# Patient Record
Sex: Female | Born: 1975 | Hispanic: No | Marital: Married | Smoking: Never smoker
Health system: Southern US, Community
[De-identification: ages and names within clinical notes are randomized; demographics above are authoritative.]

## PROBLEM LIST (undated history)

## (undated) DIAGNOSIS — Z789 Other specified health status: Secondary | ICD-10-CM

## (undated) HISTORY — PX: ECTOPIC PREGNANCY SURGERY: SHX613

---

## 1998-04-25 ENCOUNTER — Ambulatory Visit (HOSPITAL_COMMUNITY): Admission: RE | Admit: 1998-04-25 | Discharge: 1998-04-25 | Payer: Self-pay | Admitting: *Deleted

## 1998-06-09 ENCOUNTER — Ambulatory Visit (HOSPITAL_COMMUNITY): Admission: RE | Admit: 1998-06-09 | Discharge: 1998-06-09 | Payer: Self-pay | Admitting: Obstetrics

## 1998-11-04 ENCOUNTER — Inpatient Hospital Stay (HOSPITAL_COMMUNITY): Admission: AD | Admit: 1998-11-04 | Discharge: 1998-11-06 | Payer: Self-pay | Admitting: *Deleted

## 2012-05-15 ENCOUNTER — Other Ambulatory Visit (HOSPITAL_COMMUNITY)
Admission: RE | Admit: 2012-05-15 | Discharge: 2012-05-15 | Disposition: A | Discharge status: Home / Self Care | Payer: BC Managed Care – PPO | Source: Ambulatory Visit | Attending: Family Medicine | Admitting: Family Medicine

## 2012-05-15 DIAGNOSIS — Z124 Encounter for screening for malignant neoplasm of cervix: Secondary | ICD-10-CM | POA: Insufficient documentation

## 2012-05-15 DIAGNOSIS — Z1151 Encounter for screening for human papillomavirus (HPV): Secondary | ICD-10-CM | POA: Insufficient documentation

## 2012-10-24 ENCOUNTER — Ambulatory Visit (INDEPENDENT_AMBULATORY_CARE_PROVIDER_SITE_OTHER): Payer: BC Managed Care – PPO | Admitting: Physician Assistant

## 2012-10-24 VITALS — BP 100/63 | HR 80 | Temp 102.6°F | Resp 16 | Ht 64.5 in | Wt 111.6 lb

## 2012-10-24 DIAGNOSIS — R05 Cough: Secondary | ICD-10-CM

## 2012-10-24 DIAGNOSIS — R509 Fever, unspecified: Secondary | ICD-10-CM

## 2012-10-24 DIAGNOSIS — J101 Influenza due to other identified influenza virus with other respiratory manifestations: Secondary | ICD-10-CM

## 2012-10-24 DIAGNOSIS — J029 Acute pharyngitis, unspecified: Secondary | ICD-10-CM

## 2012-10-24 DIAGNOSIS — J111 Influenza due to unidentified influenza virus with other respiratory manifestations: Secondary | ICD-10-CM

## 2012-10-24 DIAGNOSIS — R059 Cough, unspecified: Secondary | ICD-10-CM

## 2012-10-24 LAB — POCT INFLUENZA A/B
Influenza A, POC: POSITIVE
Influenza B, POC: NEGATIVE

## 2012-10-24 MED ORDER — HYDROCOD POLST-CHLORPHEN POLST 10-8 MG/5ML PO LQCR: 5.0000 mL | Freq: Two times a day (BID) | ORAL | Status: DC | PRN

## 2012-10-24 MED ORDER — OSELTAMIVIR PHOSPHATE 75 MG PO CAPS: 75.0000 mg | ORAL_CAPSULE | Freq: Two times a day (BID) | ORAL | Status: DC

## 2012-10-24 MED ORDER — IBUPROFEN 200 MG PO TABS
600.0000 mg | ORAL_TABLET | Freq: Once | ORAL | Status: AC
  Administered 2012-10-24: 600 mg via ORAL

## 2012-10-24 NOTE — Progress Notes (Signed)
Patient ID: Brianna Estrada MRN: [id number], DOB: [birth year], 36 y.o. Date of Encounter: 10/24/2012, 2:04 PM  Primary Physician: No primary provider on file.  Chief Complaint: fever, chills, nasal congestion  HPI: 36 y.o. year old female with presents with 1 day history of nasal congestion, fever, chills, and slight dry cough. Complains of chest pain while coughing. No SOB, nausea, vomiting, or dizziness. Has not taken any medications yet for this. Does admit that her 52 year old son was diagnosed with the flu 3 days ago.   Patient is otherwise doing well without issues or complaints.  History reviewed. No pertinent past medical history.   Home Meds: Prior to Admission medications   Medication Sig Start Date End Date Taking? Authorizing Provider  chlorpheniramine-HYDROcodone (TUSSIONEX PENNKINETIC ER) 10-8 MG/5ML LQCR Take 5 mLs by mouth every 12 (twelve) hours as needed (cough). 10/24/12   Cheyanne Lamison Jaquita Rector, PA-C  oseltamivir (TAMIFLU) 75 MG capsule Take 1 capsule (75 mg total) by mouth 2 (two) times daily. 10/24/12   Nelva Nay, PA-C    Allergies: Not on File  History   Social History  . Marital Status: Married    Spouse Name: N/A    Number of Children: N/A  . Years of Education: N/A   Occupational History  . Not on file.   Social History Main Topics  . Smoking status: Not on file  . Smokeless tobacco: Not on file  . Alcohol Use: Not on file  . Drug Use: Not on file  . Sexually Active: Not on file   Other Topics Concern  . Not on file   Social History Narrative  . No narrative on file     Review of Systems: Constitutional: positive for chills and fever. Negative for night sweats, weight changes, or fatigue  HEENT: negative for vision changes, hearing loss. Positive for congestion, rhinorrhea, ST. No epistaxis, or sinus pressure Cardiovascular: negative for chest pain or palpitations Respiratory: positive for cough. negative for hemoptysis, wheezing, shortness of  breath. Abdominal: negative for abdominal pain, nausea, vomiting, diarrhea, or constipation Dermatological: negative for rashes. Neurologic: negative for headache, dizziness, or syncope   Physical Exam: Blood pressure 100/63, pulse 80, temperature 102.6 F (39.2 C), temperature source Oral, resp. rate 16, height 5' 4.5" (1.638 m), weight 111 lb 9.6 oz (50.621 kg)., Body mass index is 18.86 kg/(m^2). General: Well developed, well nourished, in no acute distress. Head: Normocephalic, atraumatic, eyes without discharge, sclera non-icteric, nares are without discharge. Bilateral auditory canals clear, TM's are without perforation, pearly grey and translucent with reflective cone of light bilaterally. Oral cavity moist, posterior pharynx without exudate, erythema, peritonsillar abscess, or post nasal drip.  Neck: Supple. No thyromegaly. Full ROM. No lymphadenopathy. Lungs: Clear bilaterally to auscultation without wheezes, rales, or rhonchi. Breathing is unlabored. Heart: RRR with S1 S2. No murmurs, rubs, or gallops appreciated. Msk:  Strength and tone normal for age. Extremities/Skin: Warm and dry. No clubbing or cyanosis. No edema.  Neuro: Alert and oriented X 3. Moves all extremities spontaneously. Gait is normal. CNII-XII grossly in tact. Psych:  Responds to questions appropriately with a normal affect.   Labs: Results for orders placed in visit on 10/24/12  POCT INFLUENZA A/B      Component Value Range   Influenza A, POC Positive     Influenza B, POC Negative       ASSESSMENT AND PLAN:  36 y.o. year old female with influenza Increase fluids and rest Ibuprofen and tylenol in alternating  doses q3hours Start Tamiflu 75 mg bid x 5 days Tussionex q12hours prn cough Follow up if symptoms worsen or fail to improve.  Grier Mitts, PA-C 10/24/2012 2:04 PM

## 2012-10-24 NOTE — Patient Instructions (Addendum)
Influenza Facts  Flu (influenza) is a contagious respiratory illness caused by the influenza viruses. It can cause mild to severe illness. While most healthy people recover from the flu without specific treatment and without complications, older people, young children, and people with certain health conditions are at higher risk for serious complications from the flu, including death.  CAUSES    The flu virus is spread from person to person by respiratory droplets from coughing and sneezing.   A person can also become infected by touching an object or surface with a virus on it and then touching their mouth, eye or nose.   Adults may be able to infect others from 1 day before symptoms occur and up to 7 days after getting sick. So it is possible to give someone the flu even before you know you are sick and continue to infect others while you are sick.  SYMPTOMS    Fever (usually high).   Headache.   Tiredness (can be extreme).   Cough.   Sore throat.   Runny or stuffy nose.   Body aches.   Diarrhea and vomiting may also occur, particularly in children.   These symptoms are referred to as "flu-like symptoms". A lot of different illnesses, including the common cold, can have similar symptoms.  DIAGNOSIS    There are tests that can determine if you have the flu as long you are tested within the first 2 or 3 days of illness.   A doctor's exam and additional tests may be needed to identify if you have a disease that is a complicating the flu.  RISKS AND COMPLICATIONS   Some of the complications caused by the flu include:   Bacterial pneumonia or progressive pneumonia caused by the flu virus.   Loss of body fluids (dehydration).   Worsening of chronic medical conditions, such as heart failure, asthma, or diabetes.   Sinus problems and ear infections.  HOME CARE INSTRUCTIONS    Seek medical care early on.   If you are at high risk from complications of the flu, consult your health-care provider as soon  as you develop flu-like symptoms. Those at high risk for complications include:   People 65 years or older.   People with chronic medical conditions, including diabetes.   Pregnant women.   Young children.   Your caregiver may recommend use of an antiviral medication to help treat the flu.   If you get the flu, get plenty of rest, drink a lot of liquids, and avoid using alcohol and tobacco.   You can take over-the-counter medications to relieve the symptoms of the flu if your caregiver approves. (Never give aspirin to children or teenagers who have flu-like symptoms, particularly fever).  PREVENTION   The single best way to prevent the flu is to get a flu vaccine each fall. Other measures that can help protect against the flu are:   Antiviral Medications   A number of antiviral drugs are approved for use in preventing the flu. These are prescription medications, and a doctor should be consulted before they are used.   Habits for Good Health   Cover your nose and mouth with a tissue when you cough or sneeze, throw the tissue away after you use it.   Wash your hands often with soap and water, especially after you cough or sneeze. If you are not near water, use an alcohol-based hand cleaner.   Avoid people who are sick.   If you get the   flu, stay home from work or school. Avoid contact with other people so that you do not make them sick, too.   Try not to touch your eyes, nose, or mouth as germs ore often spread this way.  IN CHILDREN, EMERGENCY WARNING SIGNS THAT NEED URGENT MEDICAL ATTENTION:   Fast breathing or trouble breathing.   Bluish skin color.   Not drinking enough fluids.   Not waking up or not interacting.   Being so irritable that the child does not want to be held.   Flu-like symptoms improve but then return with fever and worse cough.   Fever with a rash.  IN ADULTS, EMERGENCY WARNING SIGNS THAT NEED URGENT MEDICAL ATTENTION:   Difficulty breathing or shortness of breath.   Pain  or pressure in the chest or abdomen.   Sudden dizziness.   Confusion.   Severe or persistent vomiting.  SEEK IMMEDIATE MEDICAL CARE IF:   You or someone you know is experiencing any of the symptoms above. When you arrive at the emergency center,report that you think you have the flu. You may be asked to wear a mask and/or sit in a secluded area to protect others from getting sick.  MAKE SURE YOU:    Understand these instructions.   Monitor your condition.   Seek medical care if you are getting worse, or not improving.  Document Released: 10/17/2003 Document Revised: 01/06/2012 Document Reviewed: 07/13/2009  ExitCare Patient Information 2013 [employer].

## 2015-05-24 ENCOUNTER — Other Ambulatory Visit: Payer: Self-pay | Admitting: Internal Medicine

## 2015-05-24 DIAGNOSIS — Z1231 Encounter for screening mammogram for malignant neoplasm of breast: Secondary | ICD-10-CM

## 2015-05-26 ENCOUNTER — Ambulatory Visit
Admission: RE | Admit: 2015-05-26 | Discharge: 2015-05-26 | Disposition: A | Discharge status: Home / Self Care | Payer: No Typology Code available for payment source | Source: Ambulatory Visit | Attending: Internal Medicine | Admitting: Internal Medicine

## 2015-05-26 DIAGNOSIS — Z1231 Encounter for screening mammogram for malignant neoplasm of breast: Secondary | ICD-10-CM

## 2017-08-12 ENCOUNTER — Other Ambulatory Visit: Payer: Self-pay | Admitting: Internal Medicine

## 2017-08-12 DIAGNOSIS — Z1231 Encounter for screening mammogram for malignant neoplasm of breast: Secondary | ICD-10-CM

## 2017-09-02 ENCOUNTER — Ambulatory Visit: Payer: No Typology Code available for payment source

## 2017-09-03 ENCOUNTER — Ambulatory Visit
Admission: RE | Admit: 2017-09-03 | Discharge: 2017-09-03 | Disposition: A | Discharge status: Home / Self Care | Payer: BLUE CROSS/BLUE SHIELD | Source: Ambulatory Visit | Attending: Internal Medicine | Admitting: Internal Medicine

## 2017-09-03 DIAGNOSIS — Z1231 Encounter for screening mammogram for malignant neoplasm of breast: Secondary | ICD-10-CM

## 2017-09-04 ENCOUNTER — Other Ambulatory Visit: Payer: Self-pay | Admitting: Internal Medicine

## 2017-09-04 DIAGNOSIS — Z1231 Encounter for screening mammogram for malignant neoplasm of breast: Secondary | ICD-10-CM

## 2017-09-08 ENCOUNTER — Other Ambulatory Visit: Payer: Self-pay | Admitting: Internal Medicine

## 2017-09-08 DIAGNOSIS — R928 Other abnormal and inconclusive findings on diagnostic imaging of breast: Secondary | ICD-10-CM

## 2017-09-10 ENCOUNTER — Other Ambulatory Visit: Payer: Self-pay | Admitting: Internal Medicine

## 2017-09-10 ENCOUNTER — Ambulatory Visit
Admission: RE | Admit: 2017-09-10 | Discharge: 2017-09-10 | Disposition: A | Discharge status: Home / Self Care | Payer: BLUE CROSS/BLUE SHIELD | Source: Ambulatory Visit | Attending: Internal Medicine | Admitting: Internal Medicine

## 2017-09-10 ENCOUNTER — Ambulatory Visit: Payer: BLUE CROSS/BLUE SHIELD

## 2017-09-10 DIAGNOSIS — R599 Enlarged lymph nodes, unspecified: Secondary | ICD-10-CM

## 2017-09-10 DIAGNOSIS — R928 Other abnormal and inconclusive findings on diagnostic imaging of breast: Secondary | ICD-10-CM

## 2017-09-10 DIAGNOSIS — N6489 Other specified disorders of breast: Secondary | ICD-10-CM

## 2017-09-15 ENCOUNTER — Ambulatory Visit
Admission: RE | Admit: 2017-09-15 | Discharge: 2017-09-15 | Disposition: A | Discharge status: Home / Self Care | Payer: BLUE CROSS/BLUE SHIELD | Source: Ambulatory Visit | Attending: Internal Medicine | Admitting: Internal Medicine

## 2017-09-15 ENCOUNTER — Other Ambulatory Visit: Payer: BLUE CROSS/BLUE SHIELD

## 2017-09-15 ENCOUNTER — Other Ambulatory Visit: Payer: Self-pay | Admitting: Internal Medicine

## 2017-09-15 DIAGNOSIS — R599 Enlarged lymph nodes, unspecified: Secondary | ICD-10-CM

## 2017-09-15 DIAGNOSIS — N6489 Other specified disorders of breast: Secondary | ICD-10-CM

## 2017-09-30 ENCOUNTER — Other Ambulatory Visit: Payer: Self-pay | Admitting: Surgery

## 2017-09-30 DIAGNOSIS — N6320 Unspecified lump in the left breast, unspecified quadrant: Secondary | ICD-10-CM

## 2017-10-06 NOTE — Pre-Procedure Instructions (Signed)
Lindajo RoyalXiuyun Shiflett  10/06/2017      [id number] [place]- [place], [place] - [address] [place] [place] [id number] Phone: [phone] Fax: [phone]    Your procedure is scheduled on Thursday December 13.  [facility].M.  Call this number if you have problems the morning of surgery:  [phone]   Remember:  Do not eat food or drink liquids after midnight.  Take these medicines the morning of surgery with A SIP OF WATER: NONE  **DRINK Ensure Pre-surgery drink before leaving home the morning of surgery**   Do not wear jewelry, make-up or nail polish.  Do not wear lotions, powders, or perfumes, or deodorant.  Do not shave 48 hours prior to surgery.  Men may shave face and neck.  Do not bring valuables to the hospital.  [facility] is not responsible for any belongings or valuables.  Contacts, dentures or bridgework may not be worn into surgery.  Leave your suitcase in the car.  After surgery it may be brought to your room.  For patients admitted to the hospital, discharge time will be determined by your treatment team.  Patients discharged the day of surgery will not be allowed to drive home.   Special instructions:    [place]- Preparing For Surgery  Before surgery, you can play an important role. Because skin is not sterile, your skin needs to be as free of germs as possible. You can reduce the number of germs on your skin by washing with CHG (chlorahexidine gluconate) Soap before surgery.  CHG is an antiseptic cleaner which kills germs and bonds with the skin to continue killing germs even after washing.  Please do not use if you have an allergy to CHG or antibacterial soaps. If your skin becomes reddened/irritated stop using the CHG.  Do not shave (including legs and underarms) for at least 48 hours prior to first CHG shower. It is OK to shave your face.  Please follow these instructions  carefully.   1. Shower the NIGHT BEFORE SURGERY and the MORNING OF SURGERY with CHG.   2. If you chose to wash your hair, wash your hair first as usual with your normal shampoo.  3. After you shampoo, rinse your hair and body thoroughly to remove the shampoo.  4. Use CHG as you would any other liquid soap. You can apply CHG directly to the skin and wash gently with a scrungie or a clean washcloth.   5. Apply the CHG Soap to your body ONLY FROM THE NECK DOWN.  Do not use on open wounds or open sores. Avoid contact with your eyes, ears, mouth and genitals (private parts). Wash Face and genitals (private parts)  with your normal soap.  6. Wash thoroughly, paying special attention to the area where your surgery will be performed.  7. Thoroughly rinse your body with warm water from the neck down.  8. DO NOT shower/wash with your normal soap after using and rinsing off the CHG Soap.  9. Pat yourself dry with a CLEAN TOWEL.  10. Wear CLEAN PAJAMAS to bed the night before surgery, wear comfortable clothes the morning of surgery  11. Place CLEAN SHEETS on your bed the night of your first shower and DO NOT SLEEP WITH PETS.    Day of Surgery: Do not apply any deodorants/lotions. Please wear clean clothes to the hospital/surgery center.      Please read over the following fact  sheets that you were given. Coughing and Deep Breathing and Surgical Site Infection Prevention

## 2017-10-07 ENCOUNTER — Encounter (HOSPITAL_COMMUNITY): Payer: Self-pay

## 2017-10-07 ENCOUNTER — Encounter (HOSPITAL_COMMUNITY)
Admission: RE | Admit: 2017-10-07 | Discharge: 2017-10-07 | Disposition: A | Discharge status: Home / Self Care | Payer: BLUE CROSS/BLUE SHIELD | Source: Ambulatory Visit | Attending: Surgery | Admitting: Surgery

## 2017-10-07 ENCOUNTER — Other Ambulatory Visit (HOSPITAL_COMMUNITY): Payer: BLUE CROSS/BLUE SHIELD

## 2017-10-07 ENCOUNTER — Other Ambulatory Visit: Payer: Self-pay

## 2017-10-07 DIAGNOSIS — Z01818 Encounter for other preprocedural examination: Secondary | ICD-10-CM | POA: Insufficient documentation

## 2017-10-07 DIAGNOSIS — N6320 Unspecified lump in the left breast, unspecified quadrant: Secondary | ICD-10-CM | POA: Diagnosis present

## 2017-10-07 DIAGNOSIS — N6092 Unspecified benign mammary dysplasia of left breast: Secondary | ICD-10-CM | POA: Diagnosis not present

## 2017-10-07 HISTORY — DX: Other specified health status: Z78.9

## 2017-10-07 LAB — CBC
HCT: 41 % (ref 36.0–46.0)
Hemoglobin: 13.2 g/dL (ref 12.0–15.0)
MCH: 30.1 pg (ref 26.0–34.0)
MCHC: 32.2 g/dL (ref 30.0–36.0)
MCV: 93.6 fL (ref 78.0–100.0)
PLATELETS: 283 10*3/uL (ref 150–400)
RBC: 4.38 MIL/uL (ref 3.87–5.11)
RDW: 12.4 % (ref 11.5–15.5)
WBC: 5.9 10*3/uL (ref 4.0–10.5)

## 2017-10-07 LAB — HCG, SERUM, QUALITATIVE: Preg, Serum: NEGATIVE

## 2017-10-08 ENCOUNTER — Ambulatory Visit
Admission: RE | Admit: 2017-10-08 | Discharge: 2017-10-08 | Disposition: A | Discharge status: Home / Self Care | Payer: BLUE CROSS/BLUE SHIELD | Source: Ambulatory Visit | Attending: Surgery | Admitting: Surgery

## 2017-10-08 DIAGNOSIS — N6320 Unspecified lump in the left breast, unspecified quadrant: Secondary | ICD-10-CM

## 2017-10-08 NOTE — H&P (Signed)
Brianna RoyalXiuyun Mcauliff 09/30/2017 11:07 AM Location: [facility] Patient #: [id number] DOB: Nov 29, 1975 Married / Language: English / Race: Undefined Female   History of Present Illness (Daffney Greenly A. Magnus IvanBlackman MD; 09/30/2017 11:23 AM) The patient is a 41 year old female who presents with a breast mass. This is a pleasant 41 year old female referred by Dr.Yun Sun after recent diagnosis of a complex sclerosing lesion of the left breast. She had an abnormality seen in the left breast on screening mammography. Further imaging were obtained as well as an ultrasound of the breast and axilla. She ended up having a stereotactic biopsy of the left breast mass as well as a lymph node in the axilla. The lymph node was benign. The breast mass showed a left breast complex sclerosing lesion. She reports that she had a biopsy well. She has had no radius breast biopsies. She denies any problems with her breasts. She denies nipple discharge. There is no family history of breast cancer.   Past Surgical History Doristine Devoid(Chemira Jones, CMA; 09/30/2017 11:13 AM) No pertinent past surgical history   Diagnostic Studies History Doristine Devoid(Chemira Jones, CMA; 09/30/2017 11:13 AM) Pap Smear  never  Allergies (Tanisha A. Manson PasseyBrown, RMA; 09/30/2017 11:08 AM) No Known Drug Allergies 09/30/2017 Allergies Reconciled   Medication History (Tanisha A. Manson PasseyBrown, RMA; 09/30/2017 11:08 AM) No Current Medications Medications Reconciled  Social History Doristine Devoid(Chemira Jones, CMA; 09/30/2017 11:13 AM) No drug use   Pregnancy / Birth History Doristine Devoid(Chemira Jones, CMA; 09/30/2017 11:13 AM) Contraceptive History  Intrauterine device. Gravida  3    Review of Systems Doristine Devoid(Chemira Jones CMA; 09/30/2017 11:13 AM) General Not Present- Appetite Loss, Chills, Fatigue, Fever, Night Sweats, Weight Gain and Weight Loss. Skin Not Present- Change in Wart/Mole, Dryness, Hives, Jaundice, New Lesions, Non-Healing Wounds, Rash and Ulcer. Breast Not Present- Breast Mass,  Breast Pain, Nipple Discharge and Skin Changes. Female Genitourinary Not Present- Frequency, Nocturia, Painful Urination, Pelvic Pain and Urgency. Musculoskeletal Not Present- Back Pain, Joint Pain, Joint Stiffness, Muscle Pain, Muscle Weakness and Swelling of Extremities. Neurological Not Present- Decreased Memory, Fainting, Headaches, Numbness, Seizures, Tingling, Tremor, Trouble walking and Weakness. Psychiatric Not Present- Anxiety, Bipolar, Change in Sleep Pattern, Depression, Fearful and Frequent crying.  Vitals (Tanisha A. Brown RMA; 09/30/2017 11:08 AM) 09/30/2017 11:08 AM Weight: 114.5 lb Height: 66in Body Surface Area: 1.58 m Body Mass Index: 18.48 kg/m  Temp.: 98.25F  Pulse: 65 (Regular)  BP: 100/58 (Sitting, Left Arm, Standard)       Physical Exam (Rosamaria Donn A. Magnus IvanBlackman MD; 09/30/2017 11:23 AM) General Mental Status-Alert. General Appearance-Consistent with stated age. Hydration-Well hydrated. Voice-Normal.  Head and Neck Head-normocephalic, atraumatic with no lesions or palpable masses. Trachea-midline. Thyroid Gland Characteristics - normal size and consistency.  Eye Eyeball - Bilateral-Extraocular movements intact. Sclera/Conjunctiva - Bilateral-No scleral icterus.  Chest and Lung Exam Chest and lung exam reveals -quiet, even and easy respiratory effort with no use of accessory muscles and on auscultation, normal breath sounds, no adventitious sounds and normal vocal resonance. Inspection Chest Wall - Normal. Back - normal.  Breast Breast - Left-Symmetric, Non Tender, No Biopsy scars, no Dimpling, No Inflammation, No Lumpectomy scars, No Mastectomy scars, No Peau d' Orange. Breast - Right-Symmetric, Non Tender, No Biopsy scars, no Dimpling, No Inflammation, No Lumpectomy scars, No Mastectomy scars, No Peau d' Orange. Breast Lump-No Palpable Breast Mass.  Cardiovascular Cardiovascular examination reveals -normal heart  sounds, regular rate and rhythm with no murmurs and normal pedal pulses bilaterally.  Abdomen - Did not examine.  Neurologic -  Did not examine.  Musculoskeletal - Did not examine.  Lymphatic Head & Neck  General Head & Neck Lymphatics: Bilateral - Description - Normal. Axillary  General Axillary Region: Bilateral - Description - Normal. Tenderness - Non Tender. Femoral & Inguinal - Did not examine.    Assessment & Plan (Lauraine Crespo A. Magnus IvanBlackman MD; 09/30/2017 11:25 AM) BREAST MASS, LEFT (N63.20) Impression: I reviewed the patient's mammograms and the pathology results. I gave her a copy of the pathology results. We discussed complex sclerosing lesions. Currently, the recommendation is to proceed with a left breast radioactive seed guided lumpectomy to completely remove the area for complete histologic evaluation to rule out malignancy. We discussed the long-term prognosis should this not be removed. Again, the chance of malignancy is quite small. After long discussion, she wished to proceed with surgery. We discussed the surgical risk which includes but is not limited to bleeding, infection, injury to surrounding structures, the need for further surgery if malignancy is found, etc. She understands and wishes to proceed

## 2017-10-09 ENCOUNTER — Ambulatory Visit
Admission: RE | Admit: 2017-10-09 | Discharge: 2017-10-09 | Disposition: A | Discharge status: Home / Self Care | Payer: BLUE CROSS/BLUE SHIELD | Source: Ambulatory Visit | Attending: Surgery | Admitting: Surgery

## 2017-10-09 ENCOUNTER — Encounter (HOSPITAL_COMMUNITY)
Admission: RE | Disposition: A | Discharge status: Home / Self Care | Payer: Self-pay | Source: Ambulatory Visit | Attending: Surgery

## 2017-10-09 ENCOUNTER — Ambulatory Visit (HOSPITAL_COMMUNITY): Payer: BLUE CROSS/BLUE SHIELD | Admitting: Anesthesiology

## 2017-10-09 ENCOUNTER — Encounter (HOSPITAL_COMMUNITY): Payer: Self-pay | Admitting: *Deleted

## 2017-10-09 ENCOUNTER — Ambulatory Visit (HOSPITAL_COMMUNITY)
Admission: RE | Admit: 2017-10-09 | Discharge: 2017-10-09 | Disposition: A | Discharge status: Home / Self Care | Payer: BLUE CROSS/BLUE SHIELD | Source: Ambulatory Visit | Attending: Surgery | Admitting: Surgery

## 2017-10-09 DIAGNOSIS — N6320 Unspecified lump in the left breast, unspecified quadrant: Secondary | ICD-10-CM

## 2017-10-09 DIAGNOSIS — N6092 Unspecified benign mammary dysplasia of left breast: Secondary | ICD-10-CM | POA: Diagnosis not present

## 2017-10-09 HISTORY — PX: BREAST EXCISIONAL BIOPSY: SUR124

## 2017-10-09 HISTORY — PX: BREAST LUMPECTOMY WITH RADIOACTIVE SEED LOCALIZATION: SHX6424

## 2017-10-09 SURGERY — BREAST LUMPECTOMY WITH RADIOACTIVE SEED LOCALIZATION
Anesthesia: General | Site: Breast | Laterality: Left

## 2017-10-09 MED ORDER — CHLORHEXIDINE GLUCONATE CLOTH 2 % EX PADS: 6.0000 | MEDICATED_PAD | Freq: Once | CUTANEOUS | Status: DC

## 2017-10-09 MED ORDER — DEXAMETHASONE SODIUM PHOSPHATE 10 MG/ML IJ SOLN
INTRAMUSCULAR | Status: AC
  Filled 2017-10-09: qty 1

## 2017-10-09 MED ORDER — OXYCODONE HCL 5 MG PO TABS: 5.0000 mg | ORAL_TABLET | Freq: Four times a day (QID) | ORAL | 0 refills | Status: AC | PRN

## 2017-10-09 MED ORDER — PROPOFOL 10 MG/ML IV BOLUS
INTRAVENOUS | Status: DC | PRN
  Administered 2017-10-09: 100 mg via INTRAVENOUS

## 2017-10-09 MED ORDER — ACETAMINOPHEN 650 MG RE SUPP
650.0000 mg | RECTAL | Status: DC | PRN
  Filled 2017-10-09: qty 1

## 2017-10-09 MED ORDER — DEXAMETHASONE SODIUM PHOSPHATE 10 MG/ML IJ SOLN
INTRAMUSCULAR | Status: DC | PRN
  Administered 2017-10-09: 5 mg via INTRAVENOUS

## 2017-10-09 MED ORDER — ACETAMINOPHEN 500 MG PO TABS
1000.0000 mg | ORAL_TABLET | ORAL | Status: AC
  Administered 2017-10-09: 1000 mg via ORAL
  Filled 2017-10-09: qty 2

## 2017-10-09 MED ORDER — HYDROMORPHONE HCL 1 MG/ML IJ SOLN: 0.2500 mg | INTRAMUSCULAR | Status: DC | PRN

## 2017-10-09 MED ORDER — ACETAMINOPHEN 325 MG PO TABS
650.0000 mg | ORAL_TABLET | ORAL | Status: DC | PRN
Start: 2017-10-09 — End: 2017-10-11
  Filled 2017-10-09: qty 2

## 2017-10-09 MED ORDER — BUPIVACAINE-EPINEPHRINE 0.25% -1:200000 IJ SOLN
INTRAMUSCULAR | Status: DC | PRN
  Administered 2017-10-09: 7 mL

## 2017-10-09 MED ORDER — OXYCODONE HCL 5 MG PO TABS: 5.0000 mg | ORAL_TABLET | ORAL | Status: DC | PRN

## 2017-10-09 MED ORDER — LIDOCAINE HCL (CARDIAC) 20 MG/ML IV SOLN
INTRAVENOUS | Status: DC | PRN
  Administered 2017-10-09: 60 mg via INTRAVENOUS

## 2017-10-09 MED ORDER — FENTANYL CITRATE (PF) 250 MCG/5ML IJ SOLN
INTRAMUSCULAR | Status: AC
  Filled 2017-10-09: qty 5

## 2017-10-09 MED ORDER — ONDANSETRON HCL 4 MG/2ML IJ SOLN
INTRAMUSCULAR | Status: AC
  Filled 2017-10-09: qty 2

## 2017-10-09 MED ORDER — ONDANSETRON HCL 4 MG/2ML IJ SOLN
INTRAMUSCULAR | Status: DC | PRN
  Administered 2017-10-09: 4 mg via INTRAVENOUS

## 2017-10-09 MED ORDER — 0.9 % SODIUM CHLORIDE (POUR BTL) OPTIME
TOPICAL | Status: DC | PRN
Start: 2017-10-09 — End: 2017-10-09
  Administered 2017-10-09: 1000 mL

## 2017-10-09 MED ORDER — PROMETHAZINE HCL 12.5 MG PO TABS: 12.5000 mg | ORAL_TABLET | Freq: Four times a day (QID) | ORAL | 0 refills | Status: DC | PRN

## 2017-10-09 MED ORDER — FENTANYL CITRATE (PF) 100 MCG/2ML IJ SOLN
INTRAMUSCULAR | Status: DC | PRN
  Administered 2017-10-09: 100 ug via INTRAVENOUS

## 2017-10-09 MED ORDER — MIDAZOLAM HCL 5 MG/5ML IJ SOLN
INTRAMUSCULAR | Status: DC | PRN
  Administered 2017-10-09 (×2): 1 mg via INTRAVENOUS

## 2017-10-09 MED ORDER — MORPHINE SULFATE (PF) 2 MG/ML IV SOLN: 1.0000 mg | INTRAVENOUS | Status: DC | PRN

## 2017-10-09 MED ORDER — BUPIVACAINE-EPINEPHRINE (PF) 0.25% -1:200000 IJ SOLN
INTRAMUSCULAR | Status: AC
  Filled 2017-10-09: qty 30

## 2017-10-09 MED ORDER — CEFAZOLIN SODIUM-DEXTROSE 2-4 GM/100ML-% IV SOLN
2.0000 g | INTRAVENOUS | Status: AC
  Administered 2017-10-09: 2 g via INTRAVENOUS
  Filled 2017-10-09: qty 100

## 2017-10-09 MED ORDER — GABAPENTIN 300 MG PO CAPS
300.0000 mg | ORAL_CAPSULE | ORAL | Status: AC
  Administered 2017-10-09: 300 mg via ORAL
  Filled 2017-10-09: qty 1

## 2017-10-09 MED ORDER — CELECOXIB 200 MG PO CAPS
200.0000 mg | ORAL_CAPSULE | ORAL | Status: AC
  Administered 2017-10-09: 200 mg via ORAL
  Filled 2017-10-09: qty 1

## 2017-10-09 MED ORDER — KETOROLAC TROMETHAMINE 30 MG/ML IJ SOLN
INTRAMUSCULAR | Status: AC
  Filled 2017-10-09: qty 1

## 2017-10-09 MED ORDER — LACTATED RINGERS IV SOLN
INTRAVENOUS | Status: DC
  Administered 2017-10-09 (×2): via INTRAVENOUS

## 2017-10-09 MED ORDER — MIDAZOLAM HCL 2 MG/2ML IJ SOLN
INTRAMUSCULAR | Status: AC
  Filled 2017-10-09: qty 2

## 2017-10-09 MED ORDER — KETOROLAC TROMETHAMINE 15 MG/ML IJ SOLN
INTRAMUSCULAR | Status: DC | PRN
  Administered 2017-10-09: 15 mg via INTRAVENOUS

## 2017-10-09 SURGICAL SUPPLY — 42 items
ADH SKN CLS APL DERMABOND .7 (GAUZE/BANDAGES/DRESSINGS) ×1
APPLIER CLIP 9.375 MED OPEN (MISCELLANEOUS) ×3
APR CLP MED 9.3 20 MLT OPN (MISCELLANEOUS) ×1
BINDER BREAST LRG (GAUZE/BANDAGES/DRESSINGS) IMPLANT
BINDER BREAST XLRG (GAUZE/BANDAGES/DRESSINGS) IMPLANT
BLADE SURG 15 STRL LF DISP TIS (BLADE) ×1 IMPLANT
BLADE SURG 15 STRL SS (BLADE) ×3
CANISTER SUCT 3000ML PPV (MISCELLANEOUS) ×3 IMPLANT
CHLORAPREP W/TINT 26ML (MISCELLANEOUS) ×3 IMPLANT
CLIP APPLIE 9.375 MED OPEN (MISCELLANEOUS) ×1 IMPLANT
COVER PROBE W GEL 5X96 (DRAPES) ×3 IMPLANT
COVER SURGICAL LIGHT HANDLE (MISCELLANEOUS) ×3 IMPLANT
DERMABOND ADVANCED (GAUZE/BANDAGES/DRESSINGS) ×2
DERMABOND ADVANCED .7 DNX12 (GAUZE/BANDAGES/DRESSINGS) ×1 IMPLANT
DEVICE DUBIN SPECIMEN MAMMOGRA (MISCELLANEOUS) ×3 IMPLANT
DRAPE CHEST BREAST 15X10 FENES (DRAPES) ×3 IMPLANT
DRAPE UTILITY XL STRL (DRAPES) ×3 IMPLANT
ELECT CAUTERY BLADE 6.4 (BLADE) ×3 IMPLANT
ELECT REM PT RETURN 9FT ADLT (ELECTROSURGICAL) ×3
ELECTRODE REM PT RTRN 9FT ADLT (ELECTROSURGICAL) ×1 IMPLANT
GLOVE SURG SIGNA 7.5 PF LTX (GLOVE) ×3 IMPLANT
GOWN STRL REUS W/ TWL LRG LVL3 (GOWN DISPOSABLE) ×1 IMPLANT
GOWN STRL REUS W/ TWL XL LVL3 (GOWN DISPOSABLE) ×1 IMPLANT
GOWN STRL REUS W/TWL LRG LVL3 (GOWN DISPOSABLE) ×3
GOWN STRL REUS W/TWL XL LVL3 (GOWN DISPOSABLE) ×3
KIT BASIN OR (CUSTOM PROCEDURE TRAY) ×3 IMPLANT
KIT MARKER MARGIN INK (KITS) ×3 IMPLANT
NDL HYPO 25GX1X1/2 BEV (NEEDLE) ×1 IMPLANT
NEEDLE HYPO 25GX1X1/2 BEV (NEEDLE) ×3 IMPLANT
NS IRRIG 1000ML POUR BTL (IV SOLUTION) IMPLANT
PACK SURGICAL SETUP 50X90 (CUSTOM PROCEDURE TRAY) ×3 IMPLANT
PENCIL BUTTON HOLSTER BLD 10FT (ELECTRODE) ×3 IMPLANT
SPONGE LAP 18X18 X RAY DECT (DISPOSABLE) ×3 IMPLANT
SUT MNCRL AB 4-0 PS2 18 (SUTURE) ×3 IMPLANT
SUT VIC AB 3-0 SH 18 (SUTURE) ×3 IMPLANT
SYR BULB 3OZ (MISCELLANEOUS) ×3 IMPLANT
SYR CONTROL 10ML LL (SYRINGE) ×3 IMPLANT
TOWEL OR 17X24 6PK STRL BLUE (TOWEL DISPOSABLE) ×3 IMPLANT
TOWEL OR 17X26 10 PK STRL BLUE (TOWEL DISPOSABLE) ×3 IMPLANT
TUBE CONNECTING 12'X1/4 (SUCTIONS) ×1
TUBE CONNECTING 12X1/4 (SUCTIONS) ×2 IMPLANT
YANKAUER SUCT BULB TIP NO VENT (SUCTIONS) ×3 IMPLANT

## 2017-10-09 NOTE — Transfer of Care (Signed)
Immediate Anesthesia Transfer of Care Note  Patient: Brianna Estrada  Procedure(s) Performed: BREAST LUMPECTOMY WITH RADIOACTIVE SEED LOCALIZATION ERAS PATHWAY (Left Breast)  Patient Location: PACU  Anesthesia Type:General  Level of Consciousness: awake and oriented  Airway & Oxygen Therapy: Patient Spontanous Breathing and Patient connected to nasal cannula oxygen  Post-op Assessment: Report given to RN, Post -op Vital signs reviewed and stable, Patient moving all extremities and Patient moving all extremities X 4  Post vital signs: Reviewed and stable  Last Vitals:  Vitals:   10/09/17 0726 10/09/17 0909  BP: 125/71 (P) 111/70  Pulse: 64 (!) (P) 59  Resp: 18 (!) (P) 9  Temp: (!) 36.3 C (P) 36.6 C  SpO2: 100% (P) 100%    Last Pain:  Vitals:   10/09/17 0726  TempSrc: Oral      Patients Stated Pain Goal: 3 (10/09/17 0743)  Complications: No apparent anesthesia complications

## 2017-10-09 NOTE — Anesthesia Procedure Notes (Signed)
Procedure Name: LMA Insertion Date/Time: 10/09/2017 8:32 AM Performed by: Coralee RudFlores, Brandan Glauber, CRNA Pre-anesthesia Checklist: Patient identified, Emergency Drugs available and Suction available Patient Re-evaluated:Patient Re-evaluated prior to induction Oxygen Delivery Method: Circle system utilized Preoxygenation: Pre-oxygenation with 100% oxygen Induction Type: IV induction Ventilation: Mask ventilation without difficulty LMA: LMA inserted LMA Size: 3.0 Number of attempts: 1

## 2017-10-09 NOTE — Discharge Instructions (Signed)
[facility] Office Phone Number [phone]  BREAST BIOPSY/ PARTIAL MASTECTOMY: POST OP INSTRUCTIONS  Always review your discharge instruction sheet given to you by the facility where your surgery was performed.  IF YOU HAVE DISABILITY OR FAMILY LEAVE FORMS, YOU MUST BRING THEM TO THE OFFICE FOR PROCESSING.  DO NOT GIVE THEM TO YOUR DOCTOR.  1. A prescription for pain medication may be given to you upon discharge.  Take your pain medication as prescribed, if needed.  If narcotic pain medicine is not needed, then you may take acetaminophen (Tylenol) or ibuprofen (Advil) as needed. 2. Take your usually prescribed medications unless otherwise directed 3. If you need a refill on your pain medication, please contact your pharmacy.  They will contact our office to request authorization.  Prescriptions will not be filled after 5pm or on week-ends. 4. You should eat very light the first 24 hours after surgery, such as soup, crackers, pudding, etc.  Resume your normal diet the day after surgery. 5. Most patients will experience some swelling and bruising in the breast.  Ice packs and a good support bra will help.  Swelling and bruising can take several days to resolve.  6. It is common to experience some constipation if taking pain medication after surgery.  Increasing fluid intake and taking a stool softener will usually help or prevent this problem from occurring.  A mild laxative (Milk of Magnesia or Miralax) should be taken according to package directions if there are no bowel movements after 48 hours. 7. Unless discharge instructions indicate otherwise, you may remove your bandages 24-48 hours after surgery, and you may shower at that time.  You may have steri-strips (small skin tapes) in place directly over the incision.  These strips should be left on the skin for 7-10 days.  If your surgeon used skin glue on the incision, you may shower in 24 hours.  The glue will flake off over the  next 2-3 weeks.  Any sutures or staples will be removed at the office during your follow-up visit. 8. ACTIVITIES:  You may resume regular daily activities (gradually increasing) beginning the next day.  Wearing a good support bra or sports bra minimizes pain and swelling.  You may have sexual intercourse when it is comfortable. a. You may drive when you no longer are taking prescription pain medication, you can comfortably wear a seatbelt, and you can safely maneuver your car and apply brakes. b. RETURN TO WORK:  ______________________________________________________________________________________ 9. You should see your doctor in the office for a follow-up appointment approximately two weeks after your surgery.  Your doctors nurse will typically make your follow-up appointment when she calls you with your pathology report.  Expect your pathology report 2-3 business days after your surgery.  You may call to check if you do not hear from us after three days. 10. OTHER INSTRUCTIONS: _You may shower starting tomorrow  11. No vigorous activity for 1 week  12. Ice pack, Tylenol, and ibuprofen also for pain ______________________________________________________________________________________________ _____________________________________________________________________________________________________________________________________ _____________________________________________________________________________________________________________________________________ _____________________________________________________________________________________________________________________________________  WHEN TO CALL YOUR DOCTOR: 1. Fever over 101.0 2. Nausea and/or vomiting. 3. Extreme swelling or bruising. 4. Continued bleeding from incision. 5. Increased pain, redness, or drainage from the incision.  The clinic staff is available to answer your questions during regular business hours.  Please dont hesitate  to call and ask to speak to one of the nurses for clinical concerns.  If you have a medical emergency, go to the nearest emergency room or call  911.  A surgeon from [facility] is always on call at the hospital.  For further questions, please visit [email or web address]

## 2017-10-09 NOTE — Anesthesia Preprocedure Evaluation (Addendum)
Anesthesia Evaluation  Patient identified by MRN, date of birth, ID band Patient awake    Reviewed: Allergy & Precautions, H&P , NPO status , Patient's Chart, lab work & pertinent test results  Airway Mallampati: II  TM Distance: >3 FB Neck ROM: Full    Dental no notable dental hx. (+) Teeth Intact, Dental Advisory Given   Pulmonary neg pulmonary ROS,    Pulmonary exam normal breath sounds clear to auscultation       Cardiovascular negative cardio ROS   Rhythm:Regular Rate:Normal     Neuro/Psych negative neurological ROS  negative psych ROS   GI/Hepatic negative GI ROS, Neg liver ROS,   Endo/Other  negative endocrine ROS  Renal/GU negative Renal ROS  negative genitourinary   Musculoskeletal   Abdominal   Peds  Hematology negative hematology ROS (+)   Anesthesia Other Findings   Reproductive/Obstetrics negative OB ROS                            Anesthesia Physical Anesthesia Plan  ASA: II  Anesthesia Plan: General   Post-op Pain Management:    Induction: Intravenous  PONV Risk Score and Plan: 4 or greater and Ondansetron, Dexamethasone and Midazolam  Airway Management Planned: LMA  Additional Equipment:   Intra-op Plan:   Post-operative Plan: Extubation in OR  Informed Consent: I have reviewed the patients History and Physical, chart, labs and discussed the procedure including the risks, benefits and alternatives for the proposed anesthesia with the patient or authorized representative who has indicated his/her understanding and acceptance.     Dental advisory given  Plan Discussed with: CRNA  Anesthesia Plan Comments:         Anesthesia Quick Evaluation  

## 2017-10-09 NOTE — Anesthesia Postprocedure Evaluation (Signed)
Anesthesia Post Note  Patient: Brianna Estrada  Procedure(s) Performed: BREAST LUMPECTOMY WITH RADIOACTIVE SEED LOCALIZATION ERAS PATHWAY (Left Breast)     Patient location during evaluation: PACU Anesthesia Type: General Level of consciousness: awake and alert Pain management: pain level controlled Vital Signs Assessment: post-procedure vital signs reviewed and stable Respiratory status: spontaneous breathing, nonlabored ventilation and respiratory function stable Cardiovascular status: blood pressure returned to baseline and stable Postop Assessment: no apparent nausea or vomiting Anesthetic complications: no    Last Vitals:  Vitals:   10/09/17 0945 10/09/17 1000  BP: 119/69 120/81  Pulse: 60 60  Resp: 18 18  Temp:  36.6 C  SpO2: 100% 100%    Last Pain:  Vitals:   10/09/17 0726  TempSrc: Oral                 Brianna Estrada,W. EDMOND

## 2017-10-09 NOTE — Interval H&P Note (Signed)
History and Physical Interval Note: no change in H and P  10/09/2017 7:57 AM  Brianna Estrada  has presented today for surgery, with the diagnosis of left breast mass (complex sclerosing lesion)  The various methods of treatment have been discussed with the patient and family. After consideration of risks, benefits and other options for treatment, the patient has consented to  Procedure(s): BREAST LUMPECTOMY WITH RADIOACTIVE SEED LOCALIZATION ERAS PATHWAY (Left) as a surgical intervention .  The patient's history has been reviewed, patient examined, no change in status, stable for surgery.  I have reviewed the patient's chart and labs.  Questions were answered to the patient's satisfaction.     [name] A

## 2017-10-09 NOTE — Op Note (Signed)
   Brianna Estrada 10/09/2017   Pre-op Diagnosis: left breast mass (complex sclerosing lesion)     Post-op Diagnosis: same  Procedure(s): Radioactive seed guided left breast partial mastectomy  Surgeon(s): Abigail MiyamotoBlackman, Barth Trella, MD  Anesthesia: General  Staff:  Circulator: Tenna ChildSmail, Lauren C, RN Scrub Person: Valentina LucksHardesty, Xinia T; Prewett, Carin Hockharles R, RN  Estimated Blood Loss: Minimal               Specimens: Sent to path  Indications: This is a 41 year old female who presented with a left breast mass found on mammography.  There was also an enlarged lymph node in the left axilla.  She underwent stereotactic biopsy of both the mass and the lymph node.  The lymph node was benign.  The mass showed a complex sclerosing lesion in the left breast.  Excision has been recommended  Procedure: The patient presented to the operating room.  She was identified as the correct patient.  She was placed supine on the operating room table and general anesthesia was induced.  Her left breast was prepped and draped in the usual sterile fashion.  I identified the radioactive seed with the aid of the neoprobe.  It was approximately at the 12 o'clock position of the left breast.  I anesthetized the skin around the areola with Marcaine and then made a circumareolar incision with a scalpel.  I then dissected down into the breast tissue with electrocautery.  With the aid of the neoprobe, I did a partial mastectomy of the upper outer quadrant going down to the chest wall.  I could actually see the radioactive seed so I removed it with a forcep and it was placed in the specimen cup.  I then completed the partial mastectomy with electrocautery.  I marked all the margins of the specimen with marker paint.  X-ray was then performed confirming that the previous tissue marker was in the specimen.  An x-ray was also performed on the radioactive seed.  I then achieved hemostasis in the cavity with the cautery.  I anesthetized the wound  further with Marcaine.  I then closed the subcutaneous tissue with interrupted 3-0 Vicryl sutures and closed the skin with a running 4-0 Monocryl.  Prior to closure, I did place a surgical clip into the cavity from the partial mastectomy.  I then cover the incision with Dermabond.  The patient tolerated the procedure well.  All the counts were correct at the end of the procedure.  The patient was then extubated in the operating room and taken in a stable condition to the recovery room.          [name]   Date: 10/09/2017  Time: 9:03 AM

## 2017-10-10 ENCOUNTER — Encounter (HOSPITAL_COMMUNITY): Payer: Self-pay | Admitting: Surgery

## 2018-08-19 ENCOUNTER — Other Ambulatory Visit: Payer: Self-pay | Admitting: Internal Medicine

## 2018-08-19 DIAGNOSIS — Z1231 Encounter for screening mammogram for malignant neoplasm of breast: Secondary | ICD-10-CM

## 2018-10-22 ENCOUNTER — Ambulatory Visit
Admission: RE | Admit: 2018-10-22 | Discharge: 2018-10-22 | Disposition: A | Discharge status: Home / Self Care | Payer: BLUE CROSS/BLUE SHIELD | Source: Ambulatory Visit | Attending: Internal Medicine | Admitting: Internal Medicine

## 2018-10-22 DIAGNOSIS — Z1231 Encounter for screening mammogram for malignant neoplasm of breast: Secondary | ICD-10-CM

## 2019-05-02 IMAGING — MG MM CLIP PLACEMENT
4 series · 4 of 4 positions shown · non-contrast
Comparison: Previous exam(s).

CLINICAL DATA: Status post ultrasound-guided core biopsy of a mass
in the [DATE] region of the left breast and a left axillary lymph
node.

EXAM:
DIAGNOSTIC LEFT MAMMOGRAM POST ULTRASOUND GUIDED CORE BIOPSIES.

[L CC]
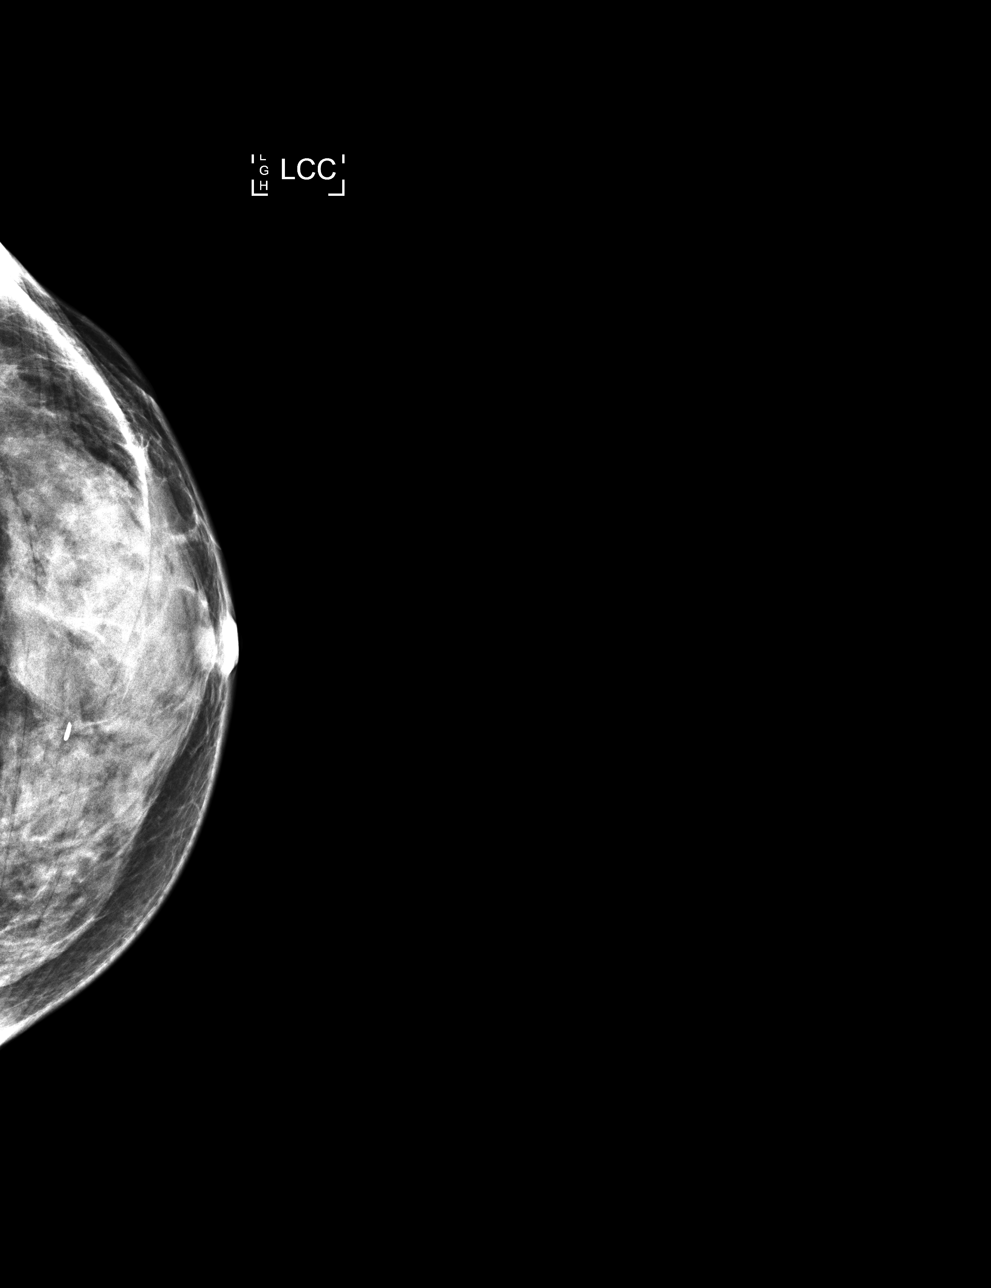

[L MLO (1 of 2)]
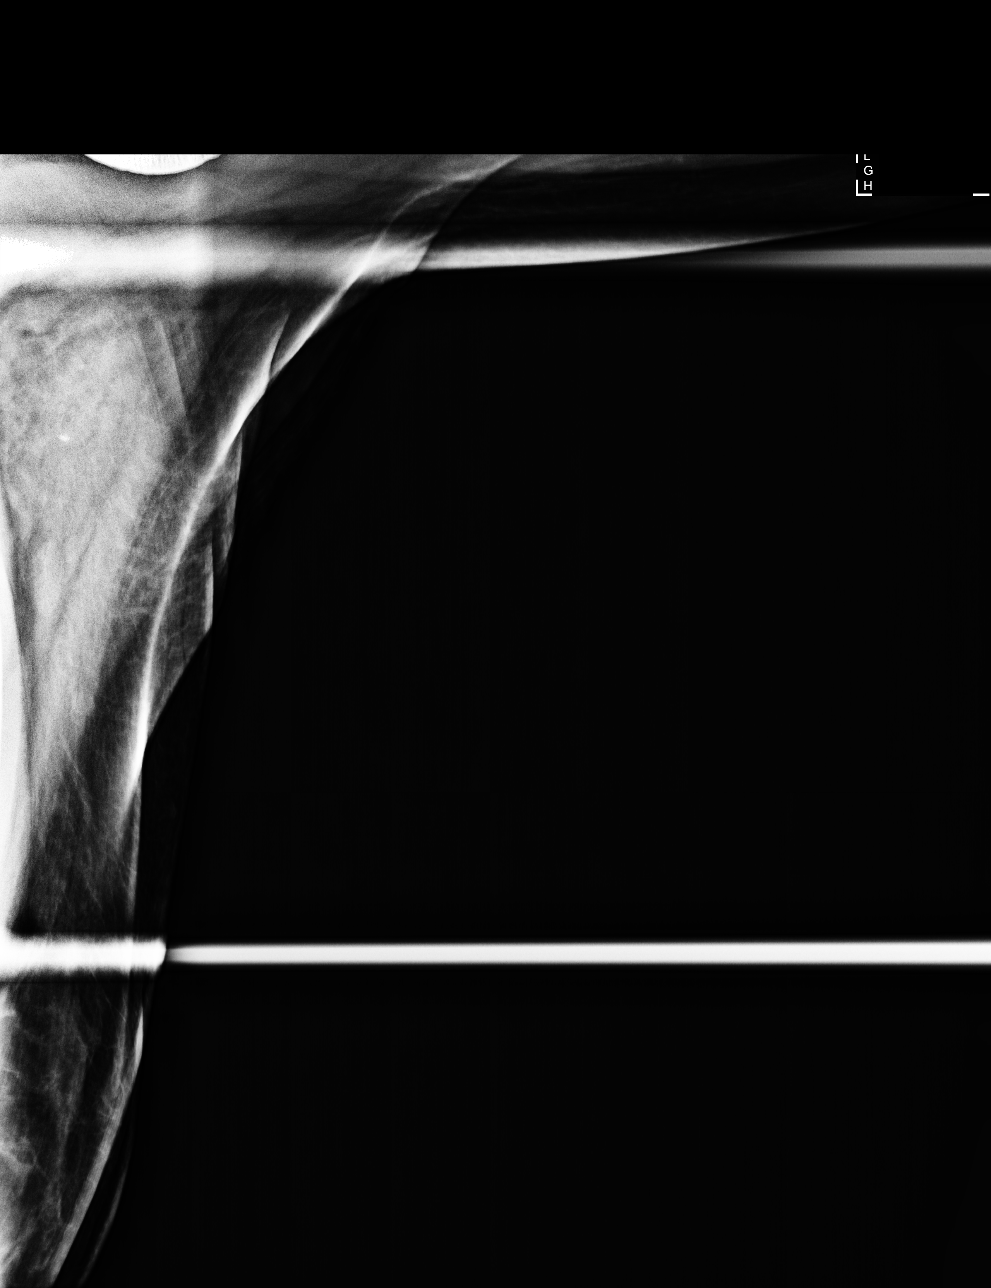

[L MLO (2 of 2)]
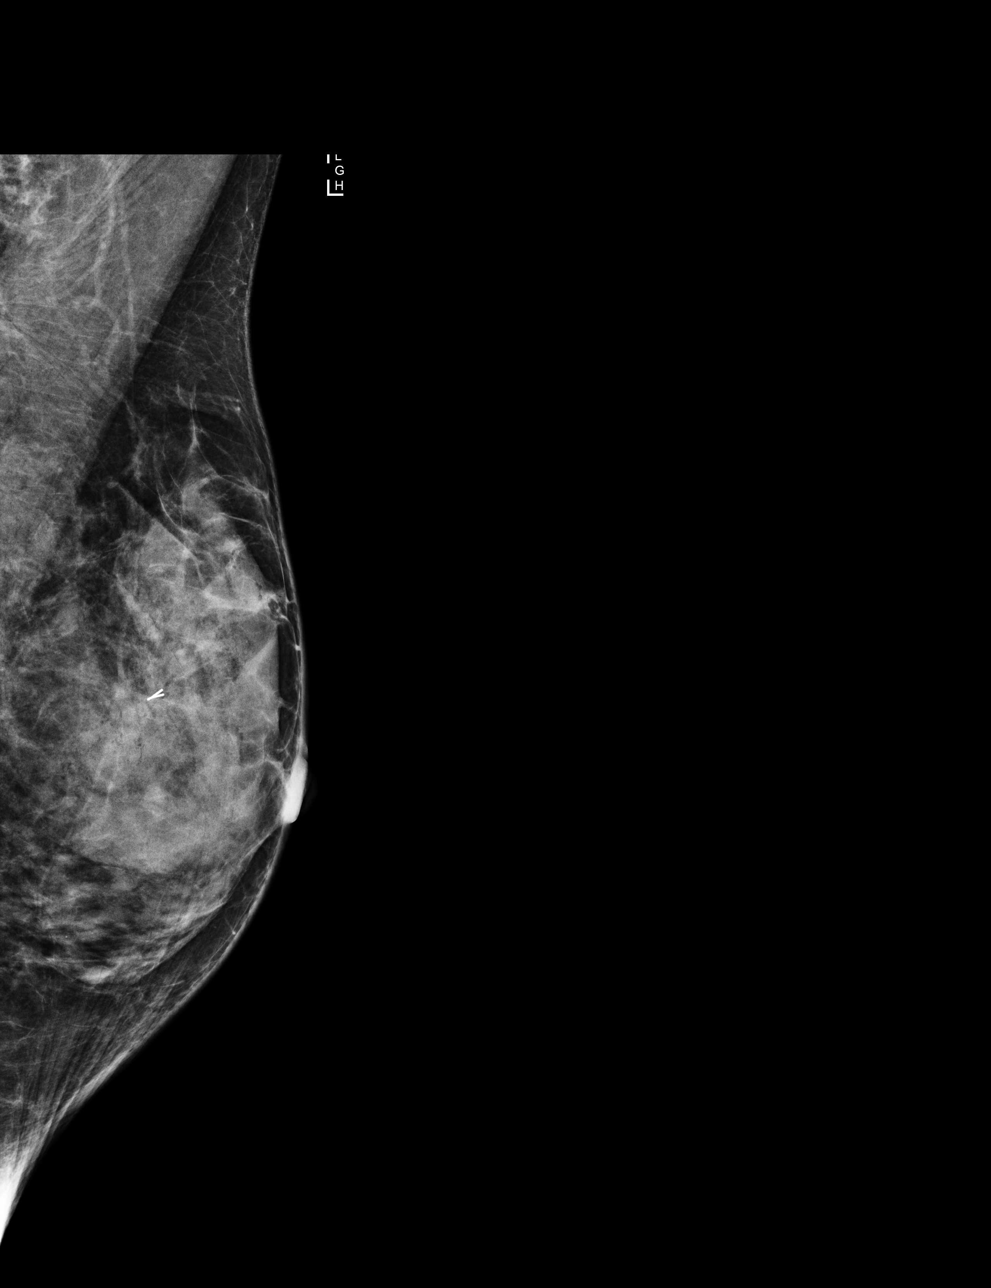

[L ML]
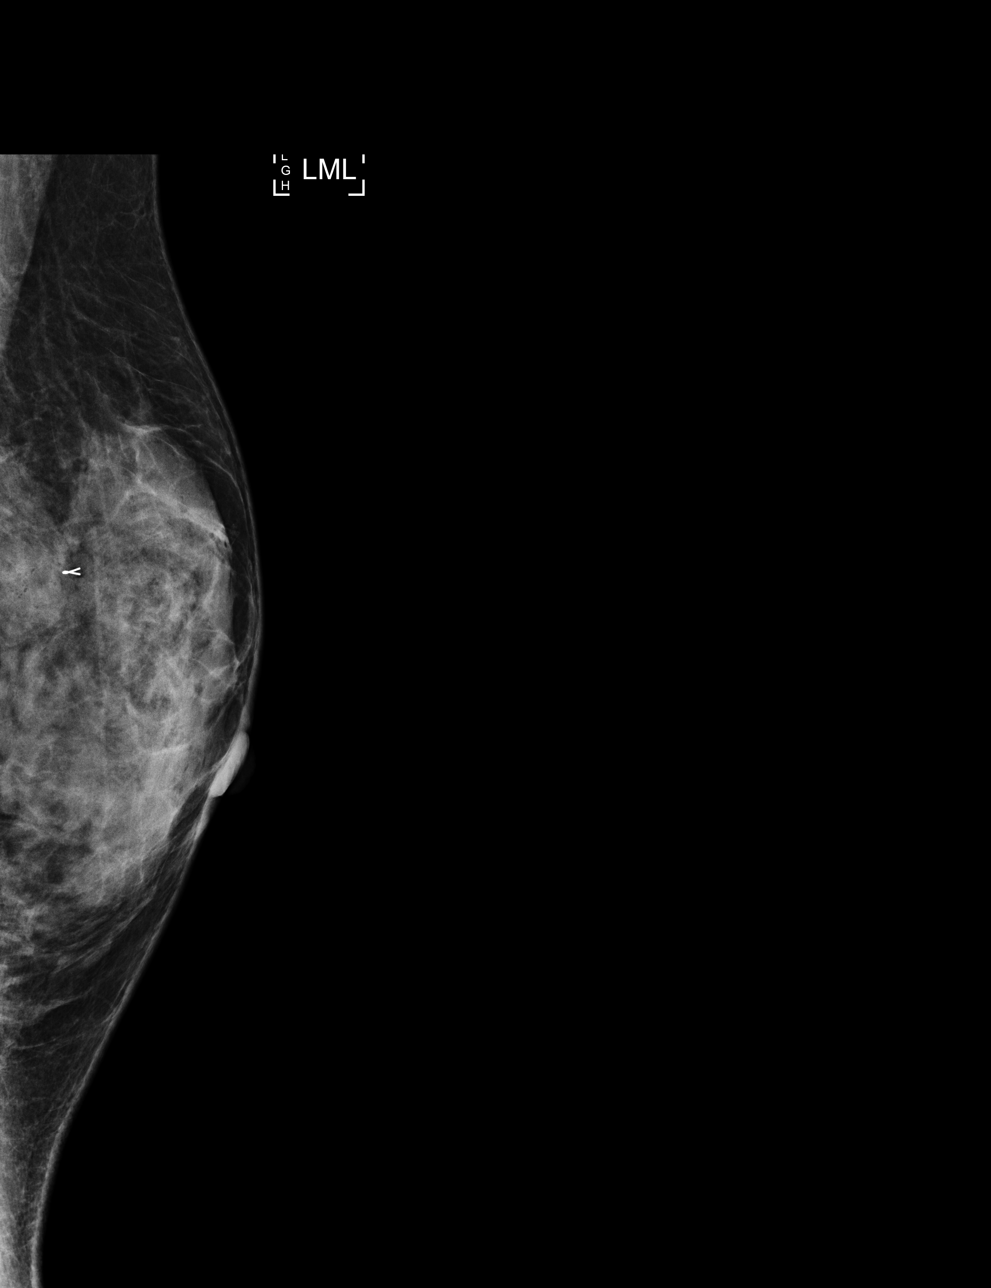

[4 of 4 positions shown; findings below may reference images not displayed]

FINDINGS: Mammographic images were obtained following ultrasound guided
biopsies of a mass in the [DATE] region of the left breast and a left
axillary lymph node. Mammographic images show there is a ribbon
shaped clip in the upper-inner quadrant of the left breast and a
HydroMARK clip in the left axilla.
IMPRESSION: Status post ultrasound-guided core biopsies of the left breast and
left axilla with pathology pending.

Final Assessment: Post Procedure Mammograms for Marker Placement

## 2019-05-02 IMAGING — US US BREAST BX W LOC DEV 1ST LESION IMG BX SPEC US GUIDE*L*
1 series · 13 of 15 positions shown · non-contrast
Comparison: Previous exam(s).

ADDENDUM:
Pathology revealed a complex sclerosing lesion in the LEFT BREAST
and LEFT AXILLARY NODE with no evidence of malignancy. This was
found to be concordant by Dr. Vesselka San. Pathology results were
discussed with the patient by telephone. The patient reported doing
well after the biopsy with tenderness at the site. Post biopsy
instructions and care were reviewed and questions were answered. The
patient was encouraged to call [REDACTED] for any additional concerns. Surgical consultation has been
arranged with Dr. Yaretzi Palomino at [REDACTED] on September 30, 2017.

Pathology results reported by Hansome Entonu RN, BSN on 09/16/2017.
CLINICAL DATA: Suspicious left breast mass.
EXAM:
ULTRASOUND GUIDED LEFT BREAST CORE NEEDLE BIOPSY

[Series 1: us breast bx w loc dev 1st lesion img bx spec us g · 0.04mm/px · 13 of 15 slices shown]
[im 1/15]
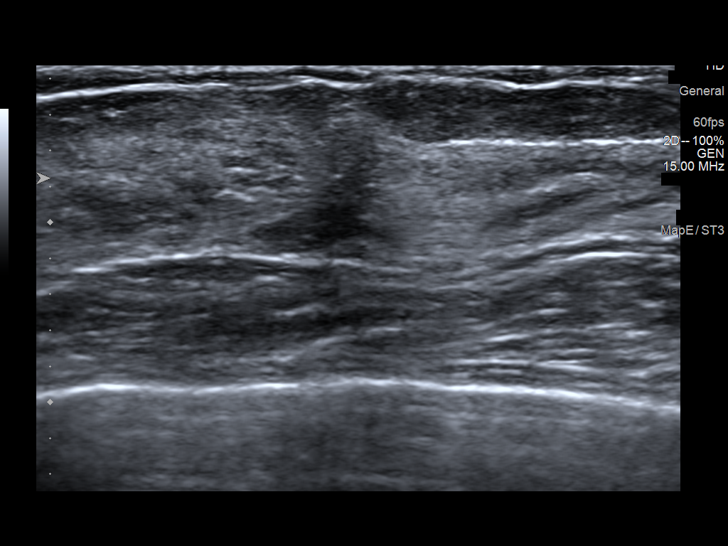
[im 2/15]
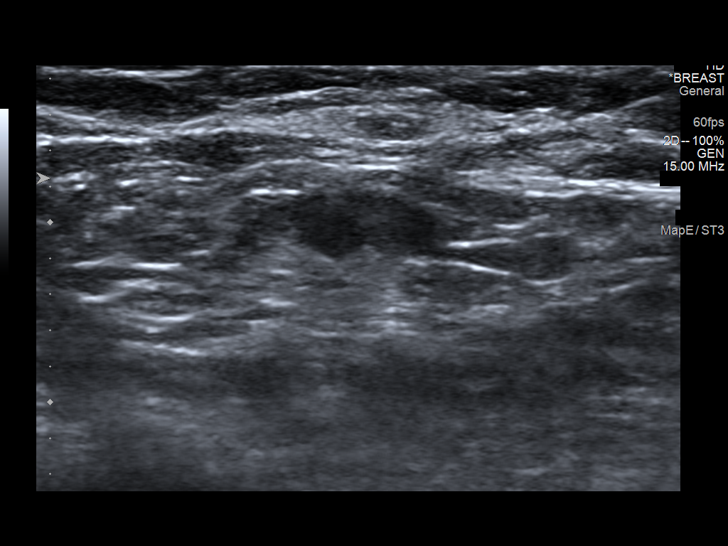
[im 3/15]
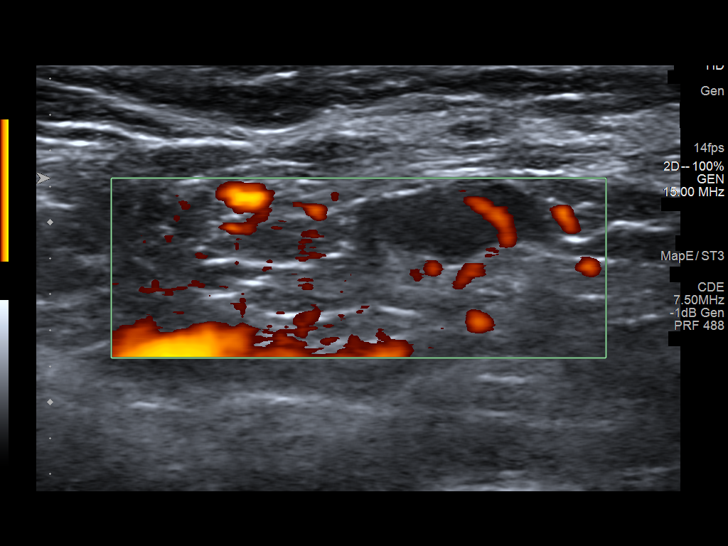
[im 5/15]
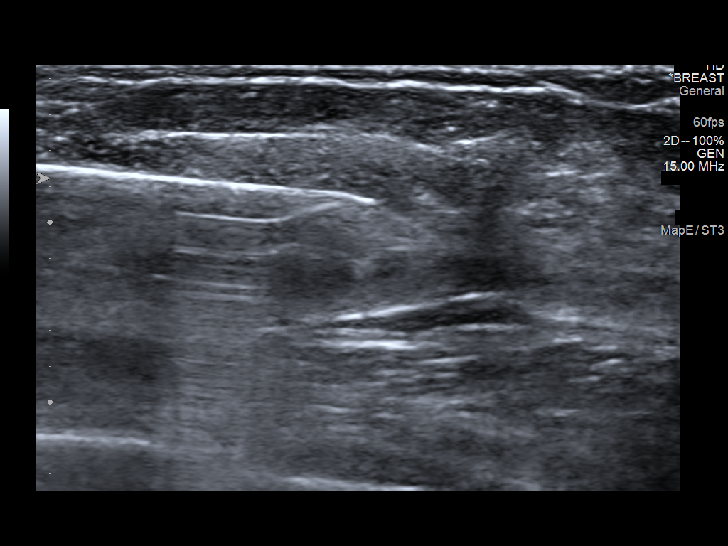
[im 6/15]
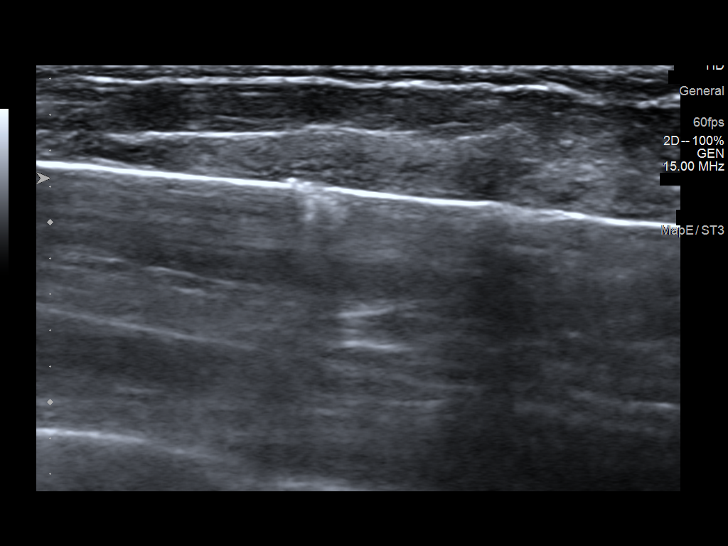
[im 7/15]
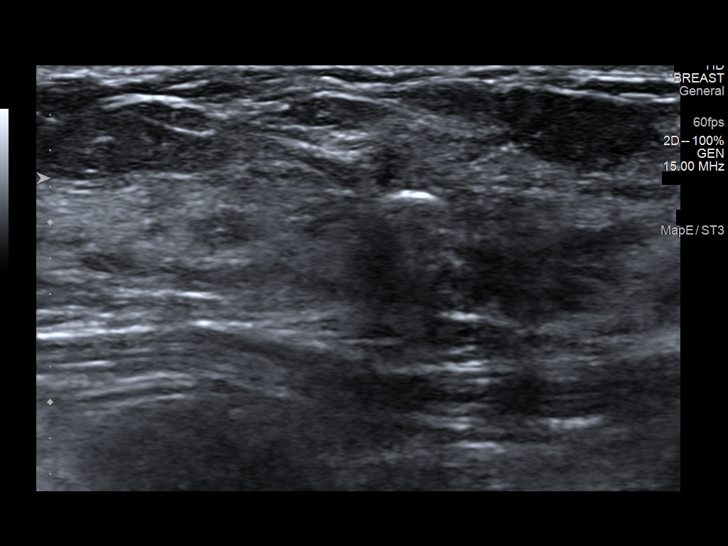
[im 8/15]
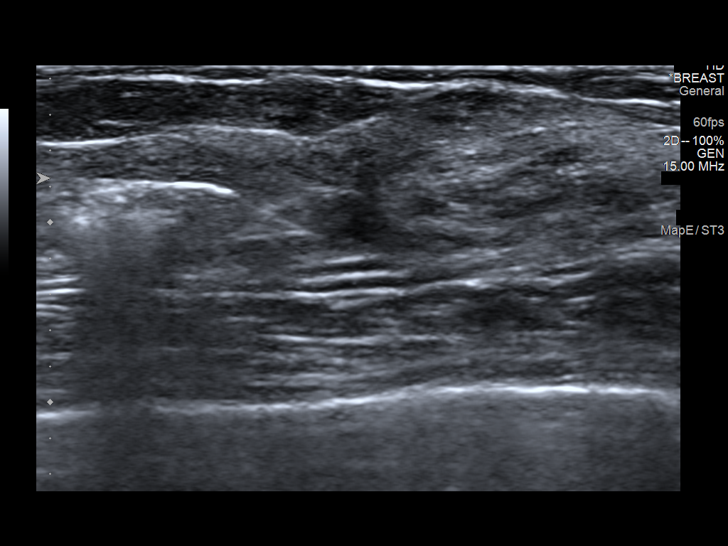
[im 9/15]
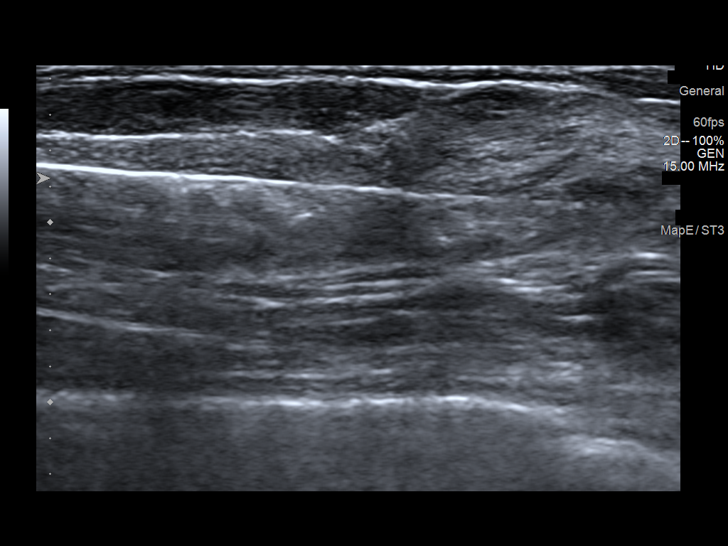
[im 10/15]
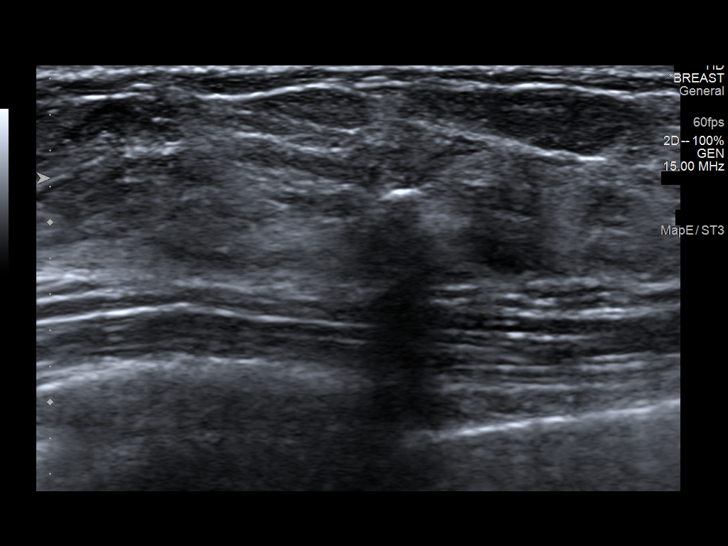
[im 11/15]
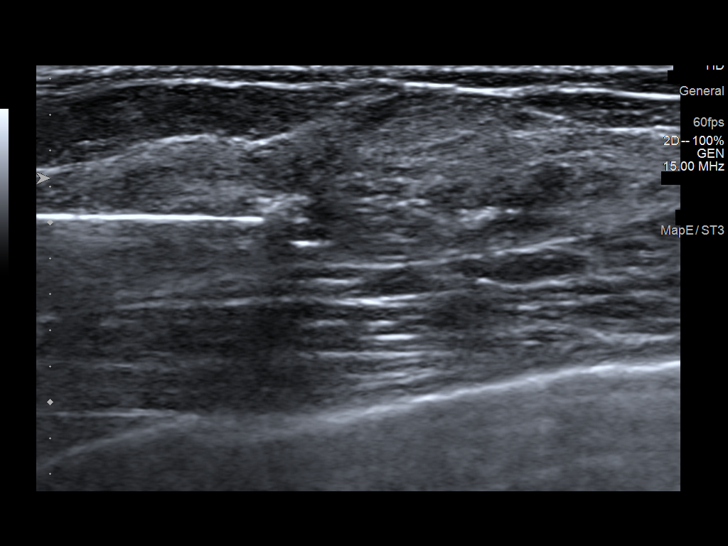
[im 13/15]
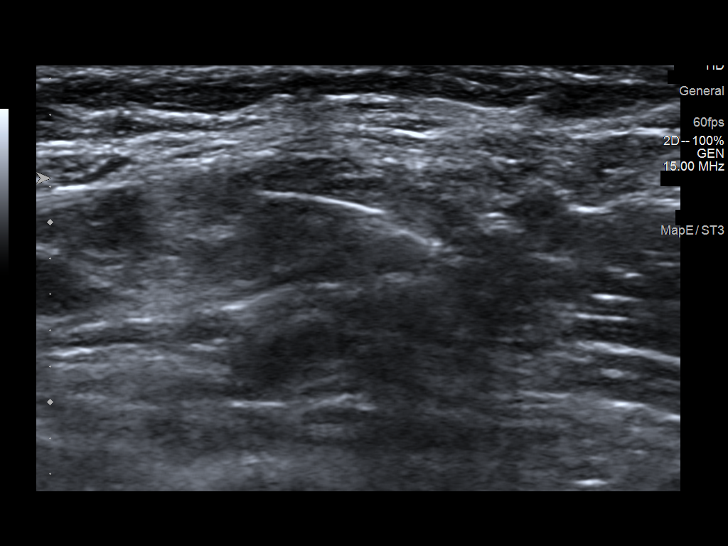
[im 14/15]
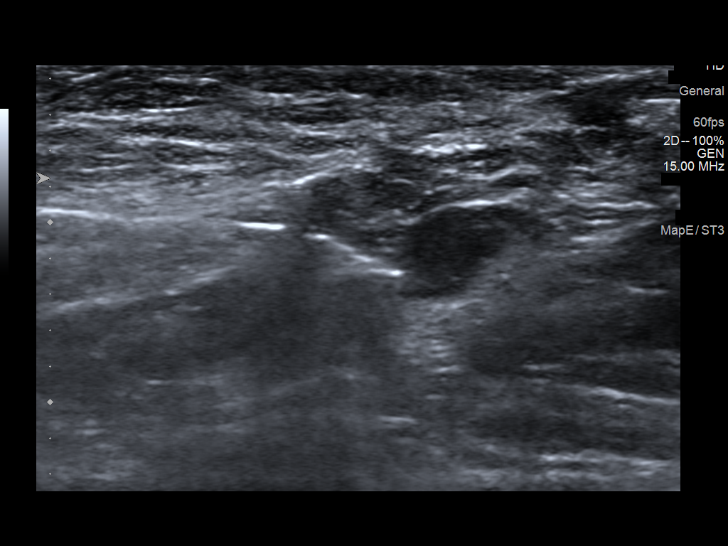
[im 15/15]
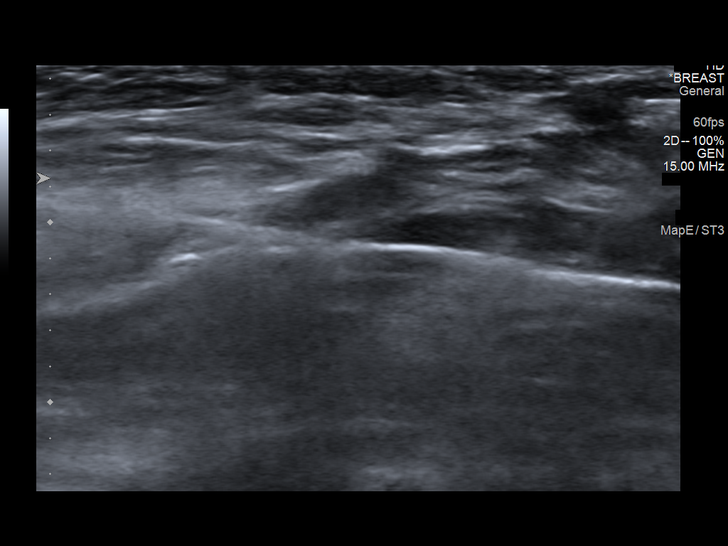

[13 of 15 positions shown; findings below may reference images not displayed]



Lesion quadrant: Upper-inner quadrant.

Using sterile technique and 1% Lidocaine as local anesthetic, under
direct ultrasound visualization, a 12 gauge Faizan device was
used to perform biopsy of mass/distortion in the [DATE] region of the
left breast using a lateral to medial approach. At the conclusion of
the procedure a ribbon shaped tissue marker clip was deployed into
the biopsy cavity. Follow up 2 view mammogram was performed and
dictated separately.
IMPRESSION: Ultrasound guided biopsy of the left breast. No apparent
complications.
